# Patient Record
Sex: Male | Born: 1965 | Race: Black or African American | Hispanic: No | Marital: Single | Smoking: Never smoker
Health system: Southern US, Community
[De-identification: ages and names within clinical notes are randomized; demographics above are authoritative.]

## PROBLEM LIST (undated history)

## (undated) DIAGNOSIS — E119 Type 2 diabetes mellitus without complications: Secondary | ICD-10-CM

## (undated) DIAGNOSIS — I10 Essential (primary) hypertension: Secondary | ICD-10-CM

## (undated) HISTORY — PX: COLONOSCOPY: SHX174

---

## 2021-04-20 ENCOUNTER — Emergency Department (HOSPITAL_BASED_OUTPATIENT_CLINIC_OR_DEPARTMENT_OTHER): Payer: PRIVATE HEALTH INSURANCE

## 2021-04-20 ENCOUNTER — Encounter (HOSPITAL_BASED_OUTPATIENT_CLINIC_OR_DEPARTMENT_OTHER): Payer: Self-pay

## 2021-04-20 ENCOUNTER — Other Ambulatory Visit: Payer: Self-pay

## 2021-04-20 ENCOUNTER — Emergency Department (HOSPITAL_BASED_OUTPATIENT_CLINIC_OR_DEPARTMENT_OTHER)
Admission: EM | Admit: 2021-04-20 | Discharge: 2021-04-20 | Disposition: A | Discharge status: Home / Self Care | Payer: PRIVATE HEALTH INSURANCE | Attending: Emergency Medicine | Admitting: Emergency Medicine

## 2021-04-20 DIAGNOSIS — M79672 Pain in left foot: Secondary | ICD-10-CM | POA: Diagnosis not present

## 2021-04-20 DIAGNOSIS — Z7984 Long term (current) use of oral hypoglycemic drugs: Secondary | ICD-10-CM | POA: Insufficient documentation

## 2021-04-20 DIAGNOSIS — Z79899 Other long term (current) drug therapy: Secondary | ICD-10-CM | POA: Insufficient documentation

## 2021-04-20 DIAGNOSIS — H9202 Otalgia, left ear: Secondary | ICD-10-CM | POA: Insufficient documentation

## 2021-04-20 DIAGNOSIS — E119 Type 2 diabetes mellitus without complications: Secondary | ICD-10-CM | POA: Diagnosis not present

## 2021-04-20 DIAGNOSIS — I10 Essential (primary) hypertension: Secondary | ICD-10-CM | POA: Insufficient documentation

## 2021-04-20 DIAGNOSIS — R52 Pain, unspecified: Secondary | ICD-10-CM

## 2021-04-20 HISTORY — DX: Type 2 diabetes mellitus without complications: E11.9

## 2021-04-20 HISTORY — DX: Essential (primary) hypertension: I10

## 2021-04-20 MED ORDER — ACETAMINOPHEN 500 MG PO TABS
1000.0000 mg | ORAL_TABLET | Freq: Once | ORAL | Status: AC
  Administered 2021-04-20: 1000 mg via ORAL
  Filled 2021-04-20: qty 2

## 2021-04-20 MED ORDER — OFLOXACIN 0.3 % OT SOLN: 5.0000 [drp] | Freq: Two times a day (BID) | OTIC | 0 refills | Status: AC

## 2021-04-20 NOTE — ED Triage Notes (Signed)
Pt arrives with c/o having a bad earache yesterday states that this morning he felt like it was draining, "stuck a paper towel down in it and it had blood on it" Pt also c/o pain to left foot, denies injury.

## 2021-04-20 NOTE — Discharge Instructions (Addendum)
Please stop using Q-tips.  There is no evidence of infection in the ear.  Please use Tylenol or ibuprofen for pain.  You may use 400 mg ibuprofen every 6 hours or 1000 mg of Tylenol every 6 hours.  You may choose to alternate between the 2.  This would be most effective.  Not to exceed 4 g of Tylenol within 24 hours.  Not to exceed 3200 mg ibuprofen 24 hours.   Drink plenty of water.   I have written a prescription for an antibiotic ear drop to use ONLY if your symptoms do not improve in 3 days.   Please follow up with your primary care provider for recheck this week.

## 2021-04-20 NOTE — ED Provider Notes (Signed)
[facility] EMERGENCY DEPARTMENT Provider Note   CSN: [id number] Arrival date & time: 04/20/21  1221     History Chief Complaint  Patient presents with  . Ear Drainage  . Foot Pain    Grant Casey is a 55 y.o. male.  HPI A 55 year old male with past medical history significant for DM 2, HTN  Patient is presented today with 2 separate symptoms.  He states that he woke up this morning and went to the restroom and felt a cramp in his left foot.  He states that his EKG is severe.  Seems to have improved some since that time.  He denies any trauma to his foot.  No injuries no falls or loss of consciousness.  He denies any other associated injuries or pains in his lower extremity.  Additionally, patient states that he has had left ear pain for the past couple days.  He states that he has been using Q-tips to clean out his ears and states that he has been vigorously cleaning his left ear because of some discomfort in there.  He states that he was pushing a Q-tip and and rolling around in his ear this morning when he saw some blood on the Q-tip.  He denies any otorrhea.  No fevers or chills.  No cough congestion sore throat chest pain or shortness of breath.  No other associate symptoms.      Past Medical History:  Diagnosis Date  . Diabetes mellitus without complication (HCC)   . Hypertension     There are no problems to display for this patient.   Past Surgical History:  Procedure Laterality Date  . COLONOSCOPY         No family history on file.  Social History   Tobacco Use  . Smoking status: Never Smoker  . Smokeless tobacco: Never Used  Vaping Use  . Vaping Use: Never used  Substance Use Topics  . Alcohol use: Yes    Comment: "weekends"  . Drug use: Yes    Types: Marijuana    Home Medications Prior to Admission medications   Medication Sig Start Date End Date Taking? Authorizing Provider  amLODipine (NORVASC) 10 MG tablet Take by mouth. 04/08/20   Yes [provider]  atorvastatin (LIPITOR) 40 MG tablet Take by mouth. 04/08/20  Yes [provider]  ergocalciferol (VITAMIN D2) 1.25 MG (50000 UT) capsule Take 1 capsule by mouth once a week. 02/23/21 05/24/21 Yes [provider]  metFORMIN (GLUCOPHAGE) 500 MG tablet Take 1 tablet by mouth daily with breakfast. 03/23/21  Yes [provider]  ofloxacin (FLOXIN) 0.3 % OTIC solution Place 5 drops into the left ear 2 (two) times daily for 7 days. 04/20/21 04/27/21 Yes Gailen Shelter, PA    Allergies    Patient has no known allergies.  Review of Systems   Review of Systems  Constitutional: Negative for chills and fever.  HENT: Positive for ear pain. Negative for congestion.   Eyes: Negative for pain.  Respiratory: Negative for cough and shortness of breath.   Cardiovascular: Negative for chest pain and leg swelling.  Gastrointestinal: Negative for abdominal pain and vomiting.  Genitourinary: Negative for dysuria.  Musculoskeletal: Negative for myalgias.       Left foot pain  Skin: Negative for rash.  Neurological: Negative for dizziness and headaches.    Physical Exam Updated Vital Signs BP 118/80 (BP Location: Left Arm)   Pulse 97   Temp 99.5 F (37.5 C) (Oral)  Resp 18   Ht 5\' 5"  (1.651 m)   Wt 107 kg   SpO2 99%   BMI 39.27 kg/m   Physical Exam Vitals and nursing note reviewed.  Constitutional:      General: He is not in acute distress.    Comments: Pleasant well-appearing 55 year old.  In no acute distress.  Sitting comfortably in bed.  Able answer questions appropriately follow commands. No increased work of breathing. Speaking in full sentences.  HENT:     Head: Normocephalic and atraumatic.     Right Ear: Tympanic membrane normal.     Left Ear: Tympanic membrane normal.     Ears:     Comments: Bilateral TMs without abnormalities.  Left EAC with some small amount of scabbing with evidence of recent bleed.  No maceration of external  auditory canal.  No purulence.    Nose: Nose normal.     Mouth/Throat:     Mouth: Mucous membranes are moist.  Eyes:     General: No scleral icterus. Cardiovascular:     Rate and Rhythm: Normal rate and regular rhythm.     Pulses: Normal pulses.     Comments: Radial artery pulses 3+ and symmetric heart rate 80 Pulmonary:     Effort: Pulmonary effort is normal. No respiratory distress.  Musculoskeletal:     Cervical back: Normal range of motion.     Right lower leg: No edema.     Left lower leg: No edema.     Comments: No significant tenderness palpation of the left foot.  No step-off deformity rash or bruising or laceration or abrasion.  Skin:    General: Skin is warm and dry.     Capillary Refill: Capillary refill takes less than 2 seconds.  Neurological:     Mental Status: He is alert. Mental status is at baseline.  Psychiatric:        Mood and Affect: Mood normal.        Behavior: Behavior normal.     ED Results / Procedures / Treatments   Labs (all labs ordered are listed, but only abnormal results are displayed) Labs Reviewed - No data to display  EKG None  Radiology DG Foot Complete Left  Result Date: 04/20/2021 CLINICAL DATA:  Foot pain in the arch, sudden onset after stepping out of the shower in a 55 year old male. EXAM: LEFT FOOT - COMPLETE 3+ VIEW COMPARISON:  None FINDINGS: Mild hallux valgus. No significant soft tissue swelling. Mild midfoot degenerative changes. Mild enthesopathy upon the calcaneus. IMPRESSION: 1. No acute fracture or dislocation. 2. Mild hallux valgus and midfoot degenerative changes. Electronically Signed   By: 57 M.D.   On: 04/20/2021 13:23    Procedures Procedures   Medications Ordered in ED Medications  acetaminophen (TYLENOL) tablet 1,000 mg (1,000 mg Oral Given 04/20/21 1448)    ED Course  I have reviewed the triage vital signs and the nursing notes.  Pertinent labs & imaging results that were available during my  care of the patient were reviewed by me and considered in my medical decision making (see chart for details).    MDM Rules/Calculators/A&P                          Patient here for left foot pain and left ear pain.  He has been using Q-tips to vigorously clean his ears.  I suspect the patient has excoriated/scratched his EAC which is caused some bleeding.  His ears do not seem to have any evidence of infection he certainly does not have any evidence of otitis media  Otitis externa is unlikely however will provide patient with ofloxacin if his ear pain does not improve with holding Q-tips and getting his here 3 days to heal he may start his ofloxacin.  Similarly if he begins to have otorrhea he can start his  eardrops.  Otherwise he will follow-up with his PCP.  His foot is well-appearing.  Rest ice and elevate compression and Tylenol.  I reviewed patient's creatinine which is within normal limits.  I recommended to use 400 mg ibuprofen interspersed with Tylenol.  Estel Scholze was evaluated in Emergency Department on 04/20/2021 for the symptoms described in the history of present illness. He was evaluated in the context of the global COVID-19 pandemic, which necessitated consideration that the patient might be at risk for infection with the SARS-CoV-2 virus that causes COVID-19. Institutional protocols and algorithms that pertain to the evaluation of patients at risk for COVID-19 are in a state of rapid change based on information released by regulatory bodies including the CDC and federal and state organizations. These policies and algorithms were followed during the patient's care in the ED.   Final Clinical Impression(s) / ED Diagnoses Final diagnoses:  Pain  Foot pain, left  Left ear pain    Rx / DC Orders ED Discharge Orders         Ordered    ofloxacin (FLOXIN) 0.3 % OTIC solution  2 times daily        04/20/21 1444           Solon Augusta [place], [place] 04/20/21 1507    Pollyann Savoy, MD 04/20/21 [phone]

## 2022-09-30 IMAGING — DX DG FOOT COMPLETE 3+V*L*
3 series · 3 of 3 positions shown · non-contrast
Comparison: None

CLINICAL DATA: Foot pain in the arch, sudden onset after stepping
out of the shower in a 54-year-old male.

EXAM:
LEFT FOOT - COMPLETE 3+ VIEW

[foot ap]
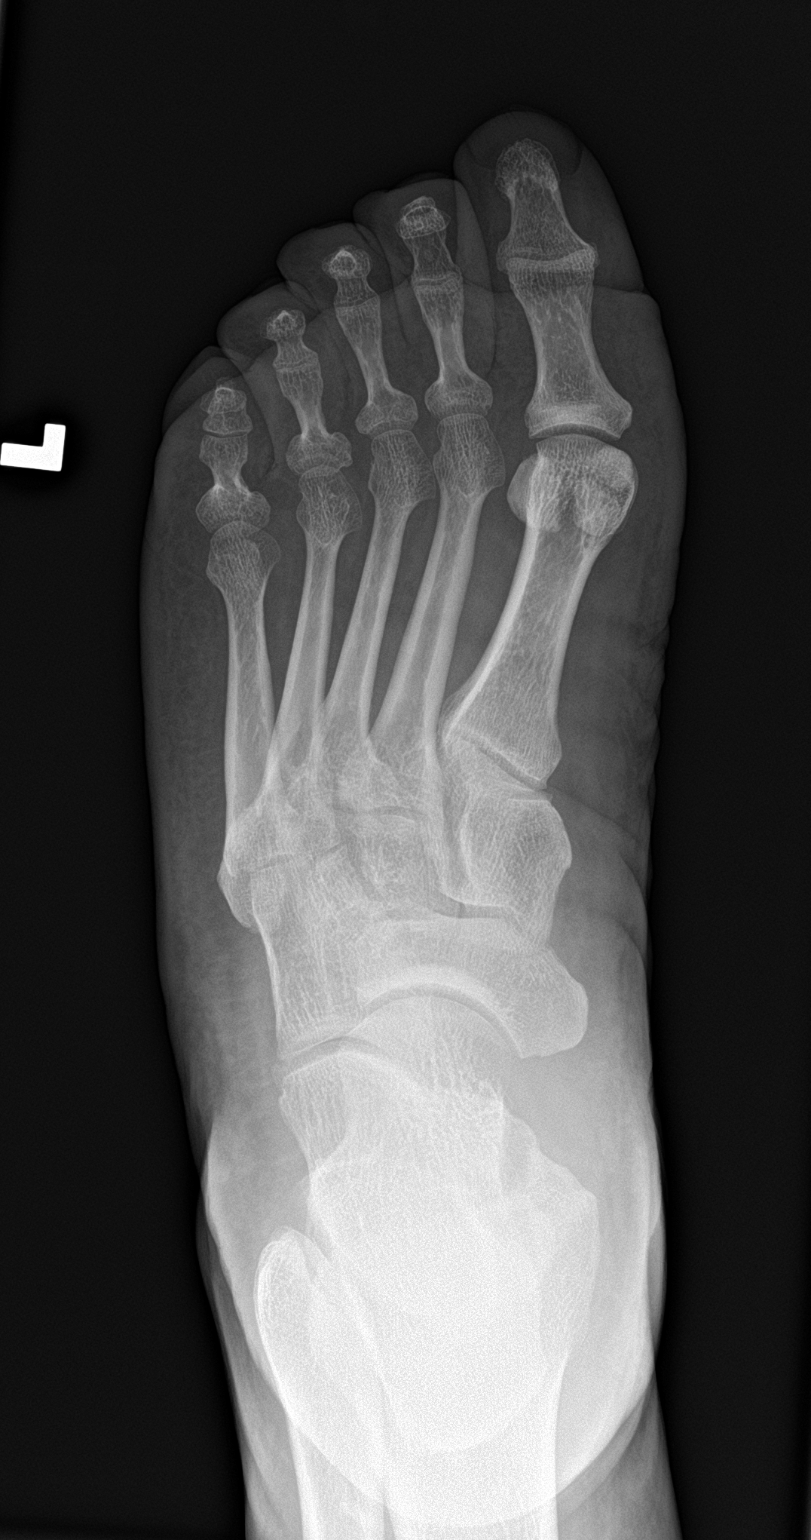

[foot obl]
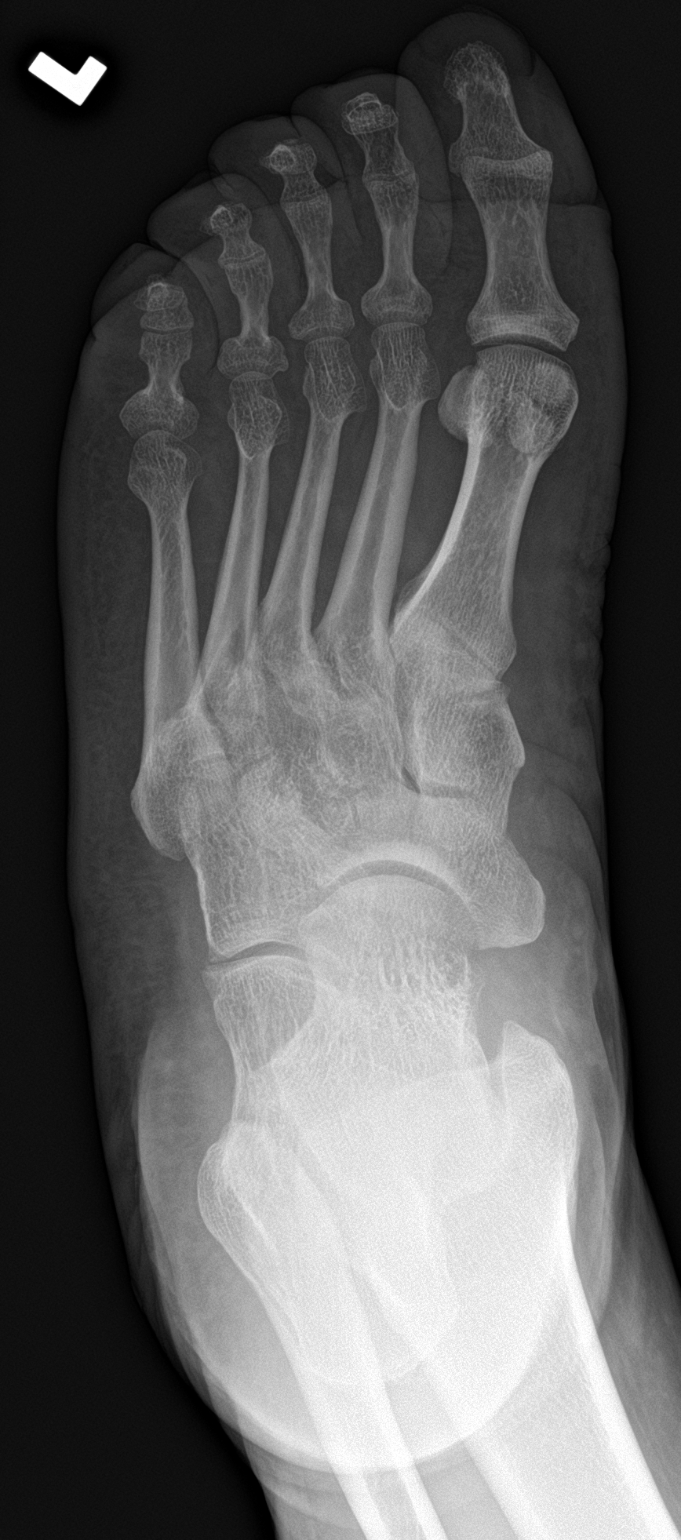

[foot lat]
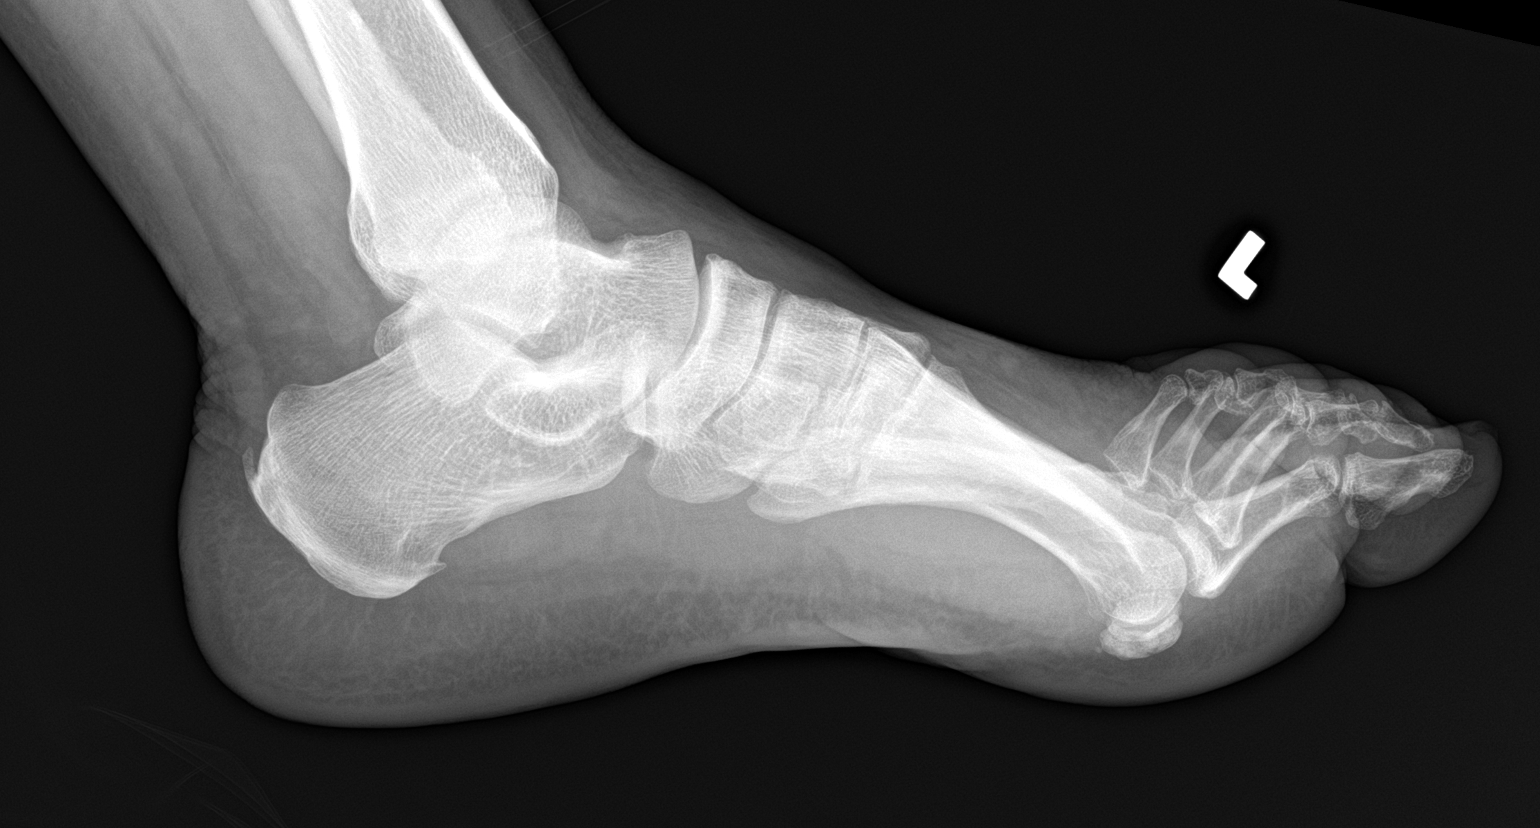

[3 of 3 positions shown; findings below may reference images not displayed]

FINDINGS: Mild hallux valgus.

No significant soft tissue swelling.

Mild midfoot degenerative changes.

Mild enthesopathy upon the calcaneus.
IMPRESSION: 1. No acute fracture or dislocation.
2. Mild hallux valgus and midfoot degenerative changes.
